# Patient Record
Sex: Female | Born: 1945 | Race: Black or African American | Hispanic: No | Marital: Married | Smoking: Never smoker
Health system: Southern US, Community
[De-identification: ages and names within clinical notes are randomized; demographics above are authoritative.]

## PROBLEM LIST (undated history)

## (undated) DIAGNOSIS — K5900 Constipation, unspecified: Secondary | ICD-10-CM

## (undated) DIAGNOSIS — N289 Disorder of kidney and ureter, unspecified: Secondary | ICD-10-CM

## (undated) DIAGNOSIS — I10 Essential (primary) hypertension: Secondary | ICD-10-CM

## (undated) HISTORY — PX: TONSILLECTOMY: SUR1361

## (undated) HISTORY — PX: HOT HEMOSTASIS: SHX5433

## (undated) HISTORY — PX: FRACTURE SURGERY: SHX138

---

## 1998-01-02 ENCOUNTER — Emergency Department (HOSPITAL_COMMUNITY): Admission: EM | Admit: 1998-01-02 | Discharge: 1998-01-03 | Payer: Self-pay

## 1998-01-24 ENCOUNTER — Encounter: Admission: RE | Admit: 1998-01-24 | Discharge: 1998-01-24 | Payer: Self-pay | Admitting: *Deleted

## 2001-04-30 ENCOUNTER — Emergency Department (HOSPITAL_COMMUNITY): Admission: EM | Admit: 2001-04-30 | Discharge: 2001-04-30 | Payer: Self-pay | Admitting: Emergency Medicine

## 2004-06-06 ENCOUNTER — Emergency Department (HOSPITAL_COMMUNITY): Admission: EM | Admit: 2004-06-06 | Discharge: 2004-06-07 | Payer: Self-pay | Admitting: Emergency Medicine

## 2012-01-30 ENCOUNTER — Emergency Department (HOSPITAL_COMMUNITY): Payer: Self-pay

## 2012-01-30 ENCOUNTER — Encounter (HOSPITAL_COMMUNITY): Payer: Self-pay | Admitting: Emergency Medicine

## 2012-01-30 ENCOUNTER — Emergency Department (HOSPITAL_COMMUNITY)
Admission: EM | Admit: 2012-01-30 | Discharge: 2012-01-30 | Disposition: A | Discharge status: Home / Self Care | Payer: Self-pay | Attending: Emergency Medicine | Admitting: Emergency Medicine

## 2012-01-30 DIAGNOSIS — K219 Gastro-esophageal reflux disease without esophagitis: Secondary | ICD-10-CM | POA: Insufficient documentation

## 2012-01-30 DIAGNOSIS — E876 Hypokalemia: Secondary | ICD-10-CM | POA: Insufficient documentation

## 2012-01-30 DIAGNOSIS — I10 Essential (primary) hypertension: Secondary | ICD-10-CM | POA: Insufficient documentation

## 2012-01-30 DIAGNOSIS — Z79899 Other long term (current) drug therapy: Secondary | ICD-10-CM | POA: Insufficient documentation

## 2012-01-30 DIAGNOSIS — R109 Unspecified abdominal pain: Secondary | ICD-10-CM | POA: Insufficient documentation

## 2012-01-30 HISTORY — DX: Disorder of kidney and ureter, unspecified: N28.9

## 2012-01-30 HISTORY — DX: Constipation, unspecified: K59.00

## 2012-01-30 HISTORY — DX: Essential (primary) hypertension: I10

## 2012-01-30 LAB — URINALYSIS, ROUTINE W REFLEX MICROSCOPIC
Nitrite: NEGATIVE
Specific Gravity, Urine: 1.016 (ref 1.005–1.030)
pH: 6 (ref 5.0–8.0)

## 2012-01-30 LAB — CBC WITH DIFFERENTIAL/PLATELET
Basophils Absolute: 0 10*3/uL (ref 0.0–0.1)
HCT: 37.4 % (ref 36.0–46.0)
Hemoglobin: 13 g/dL (ref 12.0–15.0)
Lymphocytes Relative: 49 % — ABNORMAL HIGH (ref 12–46)
Monocytes Absolute: 0.4 10*3/uL (ref 0.1–1.0)
Neutro Abs: 2.2 10*3/uL (ref 1.7–7.7)
RDW: 13.2 % (ref 11.5–15.5)
WBC: 5.3 10*3/uL (ref 4.0–10.5)

## 2012-01-30 LAB — BASIC METABOLIC PANEL
Chloride: 90 mEq/L — ABNORMAL LOW (ref 96–112)
Creatinine, Ser: 0.76 mg/dL (ref 0.50–1.10)
GFR calc Af Amer: >90 mL/min (ref >90)
Potassium: 2.7 mEq/L — CL (ref 3.5–5.1)

## 2012-01-30 LAB — URINE MICROSCOPIC-ADD ON

## 2012-01-30 MED ORDER — RANITIDINE HCL 150 MG PO CAPS: 150.0000 mg | ORAL_CAPSULE | Freq: Two times a day (BID) | ORAL | Status: DC

## 2012-01-30 MED ORDER — POTASSIUM CHLORIDE ER 10 MEQ PO TBCR: 10.0000 meq | EXTENDED_RELEASE_TABLET | Freq: Two times a day (BID) | ORAL | Status: DC

## 2012-01-30 MED ORDER — POTASSIUM CHLORIDE CRYS ER 20 MEQ PO TBCR
EXTENDED_RELEASE_TABLET | ORAL | Status: AC
  Administered 2012-01-30: 20 meq
  Filled 2012-01-30: qty 20

## 2012-01-30 MED ORDER — POTASSIUM CHLORIDE CRYS ER 20 MEQ PO TBCR
40.0000 meq | EXTENDED_RELEASE_TABLET | Freq: Once | ORAL | Status: AC
  Administered 2012-01-30: 40 meq via ORAL
  Filled 2012-01-30: qty 2

## 2012-01-30 NOTE — ED Notes (Signed)
Patient states that she started to take a new anti hypertension medication yesterday and since she has had burning in her lower pelvic region to her epigastric region

## 2012-01-30 NOTE — ED Provider Notes (Signed)
History     CSN: [id number]  Arrival date & time 01/30/12  0046   First MD Initiated Contact with Patient 01/30/12 0207      Chief Complaint  Patient presents with  . Abdominal Pain    (Consider location/radiation/quality/duration/timing/severity/associated sxs/prior treatment) Patient is a 66 y.o. female presenting with abdominal pain. The history is provided by the patient (the pt complains of burning in her stomach). No language interpreter was used.  Abdominal Pain The primary symptoms of the illness include abdominal pain. The primary symptoms of the illness do not include fatigue or diarrhea. The current episode started 13 to 24 hours ago. The onset of the illness was gradual. The problem has not changed since onset. The illness is associated with recent antibiotic use. The patient states that she believes she is currently not pregnant. The patient has not had a change in bowel habit. Symptoms associated with the illness do not include chills, hematuria, frequency or back pain. Significant associated medical issues include GERD.    Past Medical History  Diagnosis Date  . Hypertension   . Renal disorder   . Constipated     Past Surgical History  Procedure Date  . Hot hemostasis   . Tonsillectomy     History reviewed. No pertinent family history.  History  Substance Use Topics  . Smoking status: Never Smoker   . Smokeless tobacco: Not on file  . Alcohol Use: No    OB History    Grav Para Term Preterm Abortions TAB SAB Ect Mult Living                  Review of Systems  Constitutional: Negative for chills and fatigue.  HENT: Negative for congestion, sinus pressure and ear discharge.   Eyes: Negative for discharge.  Respiratory: Negative for cough.   Cardiovascular: Negative for chest pain.  Gastrointestinal: Positive for abdominal pain. Negative for diarrhea.  Genitourinary: Negative for frequency and hematuria.  Musculoskeletal: Negative for back pain.    Skin: Negative for rash.  Neurological: Negative for seizures and headaches.  Hematological: Negative.   Psychiatric/Behavioral: Negative for hallucinations.    Allergies  Review of patient's allergies indicates no known allergies.  Home Medications   Current Outpatient Rx  Name Route Sig Dispense Refill  . AMLODIPINE BESYLATE 10 MG PO TABS Oral Take 10 mg by mouth daily.    Marland Kitchen HYDROCHLOROTHIAZIDE 25 MG PO TABS Oral Take 25 mg by mouth daily.    Marland Kitchen POLYETHYLENE GLYCOL 3350 PO PACK Oral Take 17 g by mouth daily as needed. For constipation    . POTASSIUM CHLORIDE ER 10 MEQ PO TBCR Oral Take 1 tablet (10 mEq total) by mouth 2 (two) times daily. 20 tablet 0  . RANITIDINE HCL 150 MG PO CAPS Oral Take 1 capsule (150 mg total) by mouth 2 (two) times daily. 60 capsule 0    BP 168/86  Pulse 85  Temp 98.4 F (36.9 C) (Oral)  Resp 20  SpO2 98%  Physical Exam  Constitutional: She is oriented to person, place, and time. She appears well-developed.  HENT:  Head: Normocephalic and atraumatic.  Eyes: Conjunctivae and EOM are normal. No scleral icterus.  Neck: Neck supple. No thyromegaly present.  Cardiovascular: Normal rate and regular rhythm.  Exam reveals no gallop and no friction rub.   No murmur heard. Pulmonary/Chest: No stridor. She has no wheezes. She has no rales. She exhibits no tenderness.  Abdominal: She exhibits no distension. There is no  tenderness. There is no rebound.  Musculoskeletal: Normal range of motion. She exhibits no edema.  Lymphadenopathy:    She has no cervical adenopathy.  Neurological: She is oriented to person, place, and time. Coordination normal.  Skin: No rash noted. No erythema.  Psychiatric: She has a normal mood and affect. Her behavior is normal.    ED Course  Procedures (including critical care time)  Labs Reviewed  URINALYSIS, ROUTINE W REFLEX MICROSCOPIC - Abnormal; Notable for the following:    Leukocytes, UA TRACE (*)     All other  components within normal limits  BASIC METABOLIC PANEL - Abnormal; Notable for the following:    Sodium 132 (*)     Potassium 2.7 (*)     Chloride 90 (*)     Glucose, Bld 117 (*)     GFR calc non Af Amer 87 (*)     All other components within normal limits  CBC WITH DIFFERENTIAL - Abnormal; Notable for the following:    Neutrophils Relative 41 (*)     Lymphocytes Relative 49 (*)     All other components within normal limits  URINE MICROSCOPIC-ADD ON - Abnormal; Notable for the following:    Squamous Epithelial / LPF FEW (*)     All other components within normal limits  URINE CULTURE   Dg Abd Acute W/chest  01/30/2012  *RADIOLOGY REPORT*  Clinical Data: Upper abdominal burning for 2 days.  ACUTE ABDOMEN SERIES (ABDOMEN 2 VIEW & CHEST 1 VIEW)  Comparison: None.  Findings: The heart size and mediastinal contours are normal. There is a suspected small granuloma at the left lung base and calcified left hilar lymph nodes.  The lungs are otherwise clear. There is no pleural effusion.  The bowel gas pattern is normal.  There is no free intraperitoneal air or suspicious abdominal calcification.  There is mild lower lumbar spine facet disease.  IMPRESSION:  1.  No active cardiopulmonary or abdominal process demonstrated. 2.  Suspected old granulomatous disease at the left lung base.  Original Report Authenticated By: Gerrianne Scale, M.D.     1. Hypokalemia   2. GERD (gastroesophageal reflux disease)       MDM          Benny Lennert, MD 01/30/12 [phone]

## 2012-01-31 LAB — URINE CULTURE

## 2012-11-04 ENCOUNTER — Encounter (HOSPITAL_BASED_OUTPATIENT_CLINIC_OR_DEPARTMENT_OTHER): Payer: Self-pay | Admitting: *Deleted

## 2012-11-04 ENCOUNTER — Emergency Department (HOSPITAL_BASED_OUTPATIENT_CLINIC_OR_DEPARTMENT_OTHER)
Admission: EM | Admit: 2012-11-04 | Discharge: 2012-11-04 | Disposition: A | Discharge status: Home / Self Care | Payer: Worker's Compensation | Attending: Emergency Medicine | Admitting: Emergency Medicine

## 2012-11-04 DIAGNOSIS — F411 Generalized anxiety disorder: Secondary | ICD-10-CM | POA: Insufficient documentation

## 2012-11-04 DIAGNOSIS — Y9389 Activity, other specified: Secondary | ICD-10-CM | POA: Insufficient documentation

## 2012-11-04 DIAGNOSIS — R110 Nausea: Secondary | ICD-10-CM | POA: Insufficient documentation

## 2012-11-04 DIAGNOSIS — Z8719 Personal history of other diseases of the digestive system: Secondary | ICD-10-CM | POA: Insufficient documentation

## 2012-11-04 DIAGNOSIS — Z87448 Personal history of other diseases of urinary system: Secondary | ICD-10-CM | POA: Insufficient documentation

## 2012-11-04 DIAGNOSIS — I10 Essential (primary) hypertension: Secondary | ICD-10-CM | POA: Insufficient documentation

## 2012-11-04 DIAGNOSIS — Z79899 Other long term (current) drug therapy: Secondary | ICD-10-CM | POA: Insufficient documentation

## 2012-11-04 DIAGNOSIS — Y92410 Unspecified street and highway as the place of occurrence of the external cause: Secondary | ICD-10-CM | POA: Insufficient documentation

## 2012-11-04 DIAGNOSIS — Z043 Encounter for examination and observation following other accident: Secondary | ICD-10-CM | POA: Insufficient documentation
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
## 2016-07-13 ENCOUNTER — Encounter (HOSPITAL_COMMUNITY): Payer: Self-pay | Admitting: Emergency Medicine

## 2016-07-13 ENCOUNTER — Emergency Department (HOSPITAL_COMMUNITY)
Admission: EM | Admit: 2016-07-13 | Discharge: 2016-07-13 | Disposition: A | Discharge status: Home / Self Care | Payer: Medicare Other | Attending: Emergency Medicine | Admitting: Emergency Medicine

## 2016-07-13 DIAGNOSIS — I10 Essential (primary) hypertension: Secondary | ICD-10-CM | POA: Insufficient documentation

## 2016-07-13 DIAGNOSIS — K219 Gastro-esophageal reflux disease without esophagitis: Secondary | ICD-10-CM | POA: Diagnosis not present

## 2016-07-13 DIAGNOSIS — Z79899 Other long term (current) drug therapy: Secondary | ICD-10-CM | POA: Diagnosis not present

## 2016-07-13 DIAGNOSIS — R1010 Upper abdominal pain, unspecified: Secondary | ICD-10-CM | POA: Diagnosis present

## 2016-07-13 LAB — COMPREHENSIVE METABOLIC PANEL
ALK PHOS: 66 U/L (ref 38–126)
ALT: 20 U/L (ref 14–54)
ANION GAP: 9 (ref 5–15)
AST: 25 U/L (ref 15–41)
Albumin: 4.4 g/dL (ref 3.5–5.0)
BUN: 28 mg/dL — ABNORMAL HIGH (ref 6–20)
CALCIUM: 9.6 mg/dL (ref 8.9–10.3)
CHLORIDE: 103 mmol/L (ref 101–111)
CO2: 22 mmol/L (ref 22–32)
Creatinine, Ser: 1.16 mg/dL — ABNORMAL HIGH (ref 0.44–1.00)
GFR calc Af Amer: 55 mL/min — ABNORMAL LOW (ref >60)
GFR calc non Af Amer: 47 mL/min — ABNORMAL LOW (ref >60)
GLUCOSE: 108 mg/dL — AB (ref 65–99)
Potassium: 4.1 mmol/L (ref 3.5–5.1)
SODIUM: 134 mmol/L — AB (ref 135–145)
Total Bilirubin: 0.6 mg/dL (ref 0.3–1.2)
Total Protein: 7.9 g/dL (ref 6.5–8.1)

## 2016-07-13 LAB — CBC
HCT: 33.9 % — ABNORMAL LOW (ref 36.0–46.0)
HEMOGLOBIN: 11.7 g/dL — AB (ref 12.0–15.0)
MCH: 30.6 pg (ref 26.0–34.0)
MCHC: 34.5 g/dL (ref 30.0–36.0)
MCV: 88.7 fL (ref 78.0–100.0)
PLATELETS: 340 10*3/uL (ref 150–400)
RBC: 3.82 MIL/uL — ABNORMAL LOW (ref 3.87–5.11)
RDW: 13.5 % (ref 11.5–15.5)
WBC: 4.4 10*3/uL (ref 4.0–10.5)

## 2016-07-13 LAB — LIPASE, BLOOD: Lipase: 19 U/L (ref 11–51)

## 2016-07-13 MED ORDER — GI COCKTAIL ~~LOC~~
30.0000 mL | Freq: Once | ORAL | Status: AC
  Administered 2016-07-13: 30 mL via ORAL
  Filled 2016-07-13: qty 30

## 2016-07-13 MED ORDER — SODIUM CHLORIDE 0.9 % IV BOLUS (SEPSIS)
1000.0000 mL | Freq: Once | INTRAVENOUS | Status: AC
  Administered 2016-07-13: 1000 mL via INTRAVENOUS

## 2016-07-13 MED ORDER — PANTOPRAZOLE SODIUM 20 MG PO TBEC: 20.0000 mg | DELAYED_RELEASE_TABLET | Freq: Every day | ORAL | 0 refills | Status: DC

## 2016-07-13 NOTE — ED Provider Notes (Signed)
[facility]-EMERGENCY DEPT Provider Note   CSN: [id number] Arrival date & time: 07/13/16  0003    By signing my name below, I, Sierra Gregory, attest that this documentation has been prepared under the direction and in the presence of [name], MD. Electronically Signed: Valentino Gregory, ED Scribe. 07/13/16. 12:45 AM.  History   Chief Complaint Chief Complaint  Patient presents with  . Abdominal Pain   The history is provided by the patient. No language interpreter was used.   HPI Comments: Sierra Gregory is a 71 y.o. female with PMHx of HTN, renal disorder and constipation who presents to the Emergency Department complaining of moderate, constant, abdominal pain onset a week ago. Pt states she was feeling bloated and reports having a bowel movement immediately after today. She notes eating a salad today with vinaigrette dressing and reports having a burning sensation in her upper abdomen. She notes taking a  Zantac with minimal relief. Pt states she has been able to pass gas without difficulty. She reports her last bowel movement was earlier today. Denies CP, vomiting and nausea. She notes having similar episodes in the past, and states symptoms are similar.   Past Medical History:  Diagnosis Date  . Constipated   . Hypertension   . Renal disorder     There are no active problems to display for this patient.   Past Surgical History:  Procedure Laterality Date  . HOT HEMOSTASIS    . TONSILLECTOMY      OB History    No data available       Home Medications    Prior to Admission medications   Medication Sig Start Date End Date Taking? Authorizing Provider  amLODipine (NORVASC) 10 MG tablet Take 10 mg by mouth daily.   Yes Historical Provider, MD  atenolol (TENORMIN) 50 MG tablet Take 50 mg by mouth daily. 06/11/16  Yes Historical Provider, MD  spironolactone (ALDACTONE) 50 MG tablet Take 50 mg by mouth daily. 07/12/16  Yes Historical Provider, MD  pantoprazole (PROTONIX)  20 MG tablet Take 1 tablet (20 mg total) by mouth daily. 07/13/16   [name], MD  potassium chloride (K-DUR) 10 MEQ tablet Take 1 tablet (10 mEq total) by mouth 2 (two) times daily. 01/30/12 01/29/13  Bethann Berkshire, MD  ranitidine (ZANTAC) 150 MG capsule Take 1 capsule (150 mg total) by mouth 2 (two) times daily. 01/30/12 01/29/13  Bethann Berkshire, MD    Family History History reviewed. No pertinent family history.  Social History Social History  Substance Use Topics  . Smoking status: Never Smoker  . Smokeless tobacco: Never Used  . Alcohol use No     Allergies   Lasix [furosemide]   Review of Systems Review of Systems  Cardiovascular: Negative for chest pain.  Gastrointestinal: Positive for abdominal pain. Negative for nausea and vomiting.  All other systems reviewed and are negative.    Physical Exam Updated Vital Signs BP 132/66 (BP Location: Left Arm)   Pulse 63   Temp 97.6 F (36.4 C) (Oral)   Resp 16   Ht 5\' 6"  (1.676 m)   Wt 95.3 kg   SpO2 99%   BMI 33.89 kg/m  Vitals reviewed Physical Exam Physical Examination: General appearance - alert, well appearing, and in no distress Mental status - alert, oriented to person, place, and time Eyes - no conjunctival injection no scleral icterus Mouth - mucous membranes moist, pharynx normal without lesions Chest - clear to auscultation, no wheezes, rales or rhonchi,  symmetric air entry Heart - normal rate, regular rhythm, normal S1, S2, no murmurs, rubs, clicks or gallops Abdomen - soft, mild tenderness to palpation of epigastric region, no gaurding, no rebound tenderness, nondistended, no masses or organomegaly Neurological - alert, oriented, normal speech Extremities - peripheral pulses normal, no pedal edema, no clubbing or cyanosis Skin - normal coloration and turgor, no rashes  ED Treatments / Results   DIAGNOSTIC STUDIES: Oxygen Saturation is 99% on RA, normal by my interpretation.    COORDINATION OF  CARE: 12:41 AM Discussed treatment plan with pt at bedside which includes labs and pt agreed to plan.   Labs (all labs ordered are listed, but only abnormal results are displayed) Labs Reviewed  CBC - Abnormal; Notable for the following:       Result Value   RBC 3.82 (*)    Hemoglobin 11.7 (*)    HCT 33.9 (*)    All other components within normal limits  COMPREHENSIVE METABOLIC PANEL - Abnormal; Notable for the following:    Sodium 134 (*)    Glucose, Bld 108 (*)    BUN 28 (*)    Creatinine, Ser 1.16 (*)    GFR calc non Af Amer 47 (*)    GFR calc Af Amer 55 (*)    All other components within normal limits  LIPASE, BLOOD    EKG  EKG Interpretation  Date/Time:  Sunday July 13 2016 01:01:31 EST Ventricular Rate:  68 PR Interval:    QRS Duration: 86 QT Interval:  400 QTC Calculation: 426 R Axis:   45 Text Interpretation:  Sinus rhythm No old tracing to compare Confirmed by [facility]  MD, Velena Keegan [phone]([id number]) on 07/13/2016 1:48:37 AM       Radiology No results found.  Procedures Procedures (including critical care time)  Medications Ordered in ED Medications  sodium chloride 0.9 % bolus 1,000 mL (0 mLs Intravenous Stopped 07/13/16 0416)  gi cocktail (Maalox,Lidocaine,Donnatal) (30 mLs Oral Given 07/13/16 0232)     Initial Impression / Assessment and Plan / ED Course  I have reviewed the triage vital signs and the nursing notes.  Pertinent labs & imaging results that were available during my care of the patient were reviewed by me and considered in my medical decision making (see chart for details).  Clinical Course     Pt presenting with c/o epigastric pain.  Labs are reassuring in terms of lipase and LFTs.  Pt with mild renal insufficiency.  She feels improved after IV fluids and GI cocktail. EKG obtained and reassuring however doubt ACS as cause of her symptoms.  More likely related to GERD.  Pt discharge with rx for PPI.  Discharged with strict return precautions.  Pt  agreeable with plan.  Final Clinical Impressions(s) / ED Diagnoses   Final diagnoses:  Gastroesophageal reflux disease, esophagitis presence not specified    New Prescriptions Discharge Medication List as of 07/13/2016  4:21 AM    START taking these medications   Details  pantoprazole (PROTONIX) 20 MG tablet Take 1 tablet (20 mg total) by mouth daily., Starting Sun 07/13/2016, Print       I personally performed the services described in this documentation, which was scribed in my presence. The recorded information has been reviewed and is accurate.       Jerelyn ScottMartha Linker, MD 07/17/16 [phone]

## 2016-07-13 NOTE — Discharge Instructions (Signed)
Return to the ED with any concerns including vomiting and not able to keep down liquids or your medications, abdominal pain especially if it localizes to the right lower abdomen, chest pain, difficulty breathing, fever or chills, and decreased urine output, decreased level of alertness or lethargy, or any other alarming symptoms.

## 2016-07-13 NOTE — ED Triage Notes (Signed)
Pt reports feeling more bloated for the last week. Pt denies any vomiting or diarrhea. Pt states she feels pressure in upper abd.

## 2016-09-21 ENCOUNTER — Ambulatory Visit (HOSPITAL_COMMUNITY)
Admission: EM | Admit: 2016-09-21 | Discharge: 2016-09-21 | Disposition: A | Discharge status: Home / Self Care | Payer: Medicare Other | Attending: Family Medicine | Admitting: Family Medicine

## 2016-09-21 ENCOUNTER — Encounter (HOSPITAL_COMMUNITY): Payer: Self-pay | Admitting: Emergency Medicine

## 2016-09-21 DIAGNOSIS — M1711 Unilateral primary osteoarthritis, right knee: Secondary | ICD-10-CM

## 2016-09-21 NOTE — ED Triage Notes (Signed)
The patient presented to the [facility] with a complaint of right knee pain x 2 weeks. The patient denied any known injury.  

## 2016-09-21 NOTE — ED Provider Notes (Signed)
CSN: [id number]     Arrival date & time 09/21/16  1609 History   First MD Initiated Contact with Patient 09/21/16 1743     Chief Complaint  Patient presents with  . Knee Pain   (Consider location/radiation/quality/duration/timing/severity/associated sxs/prior Treatment) The history is provided by the patient.  Knee Pain  Location:  Knee Time since incident:  2 weeks Injury: no   Knee location:  R knee Pain details:    Quality:  Aching and throbbing   Radiates to:  Does not radiate   Severity:  Moderate   Onset quality:  Gradual   Duration:  2 weeks   Timing:  Intermittent   Progression:  Waxing and waning Chronicity:  New Dislocation: no   Tetanus status:  Unknown Prior injury to area:  No Relieved by:  Acetaminophen, heat, ice and movement Exacerbated by: rest, worse when sitting for extended periods and first thing in the morning. Associated symptoms: no back pain, no decreased ROM, no fatigue, no fever, no muscle weakness, no neck pain, no numbness, no stiffness, no swelling and no tingling   Risk factors: obesity   Risk factors: no frequent fractures and no known bone disorder     Past Medical History:  Diagnosis Date  . Constipated   . Hypertension   . Renal disorder    Past Surgical History:  Procedure Laterality Date  . HOT HEMOSTASIS    . TONSILLECTOMY     History reviewed. No pertinent family history. Social History  Substance Use Topics  . Smoking status: Never Smoker  . Smokeless tobacco: Never Used  . Alcohol use No   OB History    No data available     Review of Systems  Reason unable to perform ROS: as covered in HPI.  Constitutional: Negative for fatigue and fever.  Musculoskeletal: Negative for back pain, neck pain and stiffness.  All other systems reviewed and are negative.   Allergies  Lasix [furosemide]  Home Medications   Prior to Admission medications   Medication Sig Start Date End Date Taking? Authorizing Provider  amLODipine  (NORVASC) 10 MG tablet Take 10 mg by mouth daily.    Historical Provider, MD  atenolol (TENORMIN) 50 MG tablet Take 50 mg by mouth daily. 06/11/16   Historical Provider, MD  spironolactone (ALDACTONE) 50 MG tablet Take 50 mg by mouth daily. 07/12/16   Historical Provider, MD   Meds Ordered and Administered this Visit  Medications - No data to display  BP (!) 173/70 (BP Location: Right Arm)   Pulse 68   Temp 97.9 F (36.6 C) (Oral)   Resp 20   SpO2 100%  No data found.   Physical Exam  Constitutional: She is oriented to person, place, and time. She appears well-developed and well-nourished. No distress.  HENT:  Head: Normocephalic and atraumatic.  Right Ear: External ear normal.  Left Ear: External ear normal.  Musculoskeletal: She exhibits no edema or tenderness.       Right knee: She exhibits normal range of motion, no swelling, no effusion, no deformity, no erythema, normal alignment, no LCL laxity, normal patellar mobility and no MCL laxity. No tenderness found. No medial joint line, no lateral joint line, no MCL and no LCL tenderness noted.  No abnormal findings on exam of the right knee.  Neurological: She is alert and oriented to person, place, and time.  Skin: Skin is warm and dry. Capillary refill takes less than 2 seconds. No rash noted. She is not  diaphoretic. No erythema.  Psychiatric: She has a normal mood and affect. Her behavior is normal.  Nursing note and vitals reviewed.   Urgent Care Course     Procedures (including critical care time)  Labs Review Labs Reviewed - No data to display  Imaging Review No results found.    MDM   1. Osteoarthritis of right knee, unspecified osteoarthritis type   Most likely osteoarthritis the right knee. Recommend movement, warmth 15 minutes at a time 4-5 times a day, over-the-counter Tylenol. Recommend following up with primary care, or orthopedics if symptoms persist.     Dorena Bodo, NP 09/21/16 1825

## 2016-09-21 NOTE — Discharge Instructions (Signed)
For your knee pain, I recommend over-the-counter Tylenol every 6 hours. If your pain persists, follow up with your primary care provider, or an orthopedist.

## 2016-09-25 ENCOUNTER — Ambulatory Visit (INDEPENDENT_AMBULATORY_CARE_PROVIDER_SITE_OTHER): Payer: Medicare Other

## 2016-09-25 ENCOUNTER — Ambulatory Visit (INDEPENDENT_AMBULATORY_CARE_PROVIDER_SITE_OTHER): Payer: Medicare Other | Admitting: Physician Assistant

## 2016-09-25 VITALS — BP 164/72 | HR 65 | Temp 97.6°F | Resp 18 | Ht 66.0 in | Wt 242.4 lb

## 2016-09-25 DIAGNOSIS — M1711 Unilateral primary osteoarthritis, right knee: Secondary | ICD-10-CM | POA: Diagnosis not present

## 2016-09-25 DIAGNOSIS — M25561 Pain in right knee: Secondary | ICD-10-CM | POA: Diagnosis not present

## 2016-09-25 NOTE — Patient Instructions (Addendum)
I would like you to ice the knee three times per day for 15 minutes.   I would like you to elevate this a much as possible.   Please return in 2 weeks if you have no improvement of your pain.  You can also use an aspercreme along the knee.  CLINICAL DATA:  Right knee pain following climbing stairs, initial encounter EXAM: RIGHT KNEE - COMPLETE 4+ VIEW COMPARISON:  None. FINDINGS: No acute fracture or dislocation is noted. Mild tricompartmental degenerative change is seen. No joint effusion is noted. IMPRESSION: Mild degenerative change without acute abnormality per Electronically Signed   By: Alcide CleverMark  Lukens M.D.   On: 09/25/2016 10:17     IF you received an x-ray today, you will receive an invoice from [facility]. Please contact [facility] at [phone] with questions or concerns regarding your invoice.   IF you received labwork today, you will receive an invoice from [facility]. Please contact [facility] at [phone] with questions or concerns regarding your invoice.   Our billing staff will not be able to assist you with questions regarding bills from these companies.  You will be contacted with the lab results as soon as they are available. The fastest way to get your results is to activate your My Chart account. Instructions are located on the last page of this paperwork. If you have not heard from us regarding the results in 2 weeks, please contact this office.

## 2016-09-25 NOTE — Progress Notes (Signed)
[facility] [address], [place] [phone]  Date:  09/25/2016   Name:  Sierra Gregory   DOB:  08-Mar-1946   MRN:  [id number]  PCP:  PROVIDER NOT IN SYSTEM    History of Present Illness:  Sierra Gregory is a 71 y.o. female patient who presents to PCP with  Chief Complaint  Patient presents with  . Knee Pain    right knee the inside of knee hurts and sore      --she was coming up the stairs, 2 weeks ago, and felt a pull, then started to have soreness and stiffness.  5 days ago, she went to the urgent care.  Advised to take tylenol 500mg  every 6 hours.  She has been taking it 0-1 500mg  daily with no improvement, and placing ice on the right knee. No instability.  She has never had an injury or pain in this right knee.    There are no active problems to display for this patient.   Past Medical History:  Diagnosis Date  . Constipated   . Hypertension   . Renal disorder     Past Surgical History:  Procedure Laterality Date  . FRACTURE SURGERY    . HOT HEMOSTASIS    . TONSILLECTOMY      Social History  Substance Use Topics  . Smoking status: Never Smoker  . Smokeless tobacco: Never Used  . Alcohol use No    Family History  Problem Relation Age of Onset  . Cancer Mother   . Cancer Sister   . Cancer Maternal Grandmother     Allergies  Allergen Reactions  . Lasix [Furosemide]     Lowers potassium      Medication list has been reviewed and updated.  Current Outpatient Prescriptions on File Prior to Visit  Medication Sig Dispense Refill  . amLODipine (NORVASC) 10 MG tablet Take 10 mg by mouth daily.    Marland Kitchen. atenolol (TENORMIN) 50 MG tablet Take 50 mg by mouth daily.    Marland Kitchen. spironolactone (ALDACTONE) 50 MG tablet Take 50 mg by mouth daily.     No current facility-administered medications on file prior to visit.     ROS ROS otherwise unremarkable unless listed above.  Physical Examination: BP (!) 164/72 (BP Location: Left Arm, Patient  Position: Sitting, Cuff Size: Large)   Pulse 65   Temp 97.6 F (36.4 C) (Oral)   Resp 18   Ht 5\' 6"  (1.676 m)   Wt 242 lb 6.4 oz (110 kg)   SpO2 96%   BMI 39.12 kg/m  Ideal Body Weight: Weight in (lb) to have BMI = 25: 154.6  Physical Exam  Constitutional: She is oriented to person, place, and time. She appears well-developed and well-nourished. No distress.  HENT:  Head: Normocephalic and atraumatic.  Right Ear: External ear normal.  Left Ear: External ear normal.  Eyes: Conjunctivae and EOM are normal. Pupils are equal, round, and reactive to light.  Cardiovascular: Normal rate.   Pulmonary/Chest: Effort normal. No respiratory distress.  Musculoskeletal:       Right knee: She exhibits abnormal alignment (slight valgus to the alignment.). She exhibits normal range of motion, no swelling, no effusion and normal meniscus. Tenderness found. Medial joint line tenderness noted. No lateral joint line, no MCL and no LCL tenderness noted.  Neurological: She is alert and oriented to person, place, and time.  Skin: She is not diaphoretic.  Psychiatric: She has a normal mood and  affect. Her behavior is normal.    Dg Knee Complete 4 Views Right  Result Date: 09/25/2016 CLINICAL DATA:  Right knee pain following climbing stairs, initial encounter EXAM: RIGHT KNEE - COMPLETE 4+ VIEW COMPARISON:  None. FINDINGS: No acute fracture or dislocation is noted. Mild tricompartmental degenerative change is seen. No joint effusion is noted. IMPRESSION: Mild degenerative change without acute abnormality per Electronically Signed   By: Alcide Clever M.D.   On: 09/25/2016 10:17     Assessment and Plan: Sierra Gregory is a 71 y.o. female who is here today for cc of right knee pain. Advised icing.  Placed in knee brace. Due to Kfunction, continue tylenol more aptly, and she can also use aspercreme otc. rtc in 2 weeks if no improvement.   Tricompartment osteoarthritis of right knee  Acute pain of right  knee - Plan: DG Knee Complete 4 Views Right  Trena Platt, PA-C [facility] 3/23/20189:54 AM

## 2018-05-11 ENCOUNTER — Ambulatory Visit (INDEPENDENT_AMBULATORY_CARE_PROVIDER_SITE_OTHER): Payer: Medicare Other

## 2018-05-11 ENCOUNTER — Encounter: Payer: Self-pay | Admitting: Podiatry

## 2018-05-11 ENCOUNTER — Ambulatory Visit (INDEPENDENT_AMBULATORY_CARE_PROVIDER_SITE_OTHER): Payer: Medicare Other | Admitting: Podiatry

## 2018-05-11 ENCOUNTER — Encounter

## 2018-05-11 VITALS — BP 138/75 | HR 70 | Resp 16

## 2018-05-11 DIAGNOSIS — Q828 Other specified congenital malformations of skin: Secondary | ICD-10-CM | POA: Diagnosis not present

## 2018-05-11 DIAGNOSIS — M2041 Other hammer toe(s) (acquired), right foot: Secondary | ICD-10-CM

## 2018-05-11 NOTE — Progress Notes (Signed)
  Subjective:  Patient ID: Sierra Gregory, female    DOB: 1946-06-05,  MRN: [id number] HPI Chief Complaint  Patient presents with  . Toe Pain    5th toe right - corn interdigital, got infected x 1 week ago, been on cephalexin (completed), better, but still tender  . New Patient (Initial Visit)    72 y.o. female presents with the above complaint.   ROS: Denies fever chills nausea vomiting muscle aches pains.  Past Medical History:  Diagnosis Date  . Constipated   . Hypertension   . Renal disorder    Past Surgical History:  Procedure Laterality Date  . FRACTURE SURGERY    . HOT HEMOSTASIS    . TONSILLECTOMY      Current Outpatient Medications:  .  amLODipine (NORVASC) 10 MG tablet, Take 10 mg by mouth daily., Disp: , Rfl:  .  atenolol (TENORMIN) 50 MG tablet, Take 50 mg by mouth daily., Disp: , Rfl:  .  spironolactone (ALDACTONE) 50 MG tablet, Take 50 mg by mouth daily., Disp: , Rfl:   Allergies  Allergen Reactions  . Lasix [Furosemide]     Lowers potassium     Review of Systems Objective:   Vitals:   05/11/18 1007  BP: 138/75  Pulse: 70  Resp: 16    General: Well developed, nourished, in no acute distress, alert and oriented x3   Dermatological: Skin is warm, dry and supple bilateral. Nails x 10 are well maintained; remaining integument appears unremarkable at this time. There are no open sores, no preulcerative lesions, no rash or signs of infection present.  Reactive hyperkeratotic lesion medial aspect of the fifth digit right foot appears to be healing very nicely at this point but you can tell if this was once infected and was a blister.  Vascular: Dorsalis Pedis artery and Posterior Tibial artery pedal pulses are 2/4 bilateral with immedate capillary fill time. Pedal hair growth present. No varicosities and no lower extremity edema present bilateral.   Neruologic: Grossly intact via light touch bilateral. Vibratory intact via tuning fork bilateral. Protective  threshold with Semmes Wienstein monofilament intact to all pedal sites bilateral. Patellar and Achilles deep tendon reflexes 2+ bilateral. No Babinski or clonus noted bilateral.   Musculoskeletal: No gross boney pedal deformities bilateral. No pain, crepitus, or limitation noted with foot and ankle range of motion bilateral. Muscular strength 5/5 in all groups tested bilateral.  Gait: Unassisted, Nonantalgic.    Radiographs:  None taken  Assessment & Plan:   Assessment: Interdigital callus that has been infected but is healed now.  Plan: Debrided reactive hyperkeratosis for her today.  Placed padding discussed the need for surgical procedure.      T. Bison, [place]

## 2018-06-28 IMAGING — DX DG KNEE COMPLETE 4+V*R*
4 series · 4 of 4 positions shown · non-contrast
Comparison: None.

CLINICAL DATA: Right knee pain following climbing stairs, initial
encounter

EXAM:
RIGHT KNEE - COMPLETE 4+ VIEW

[knee ap]
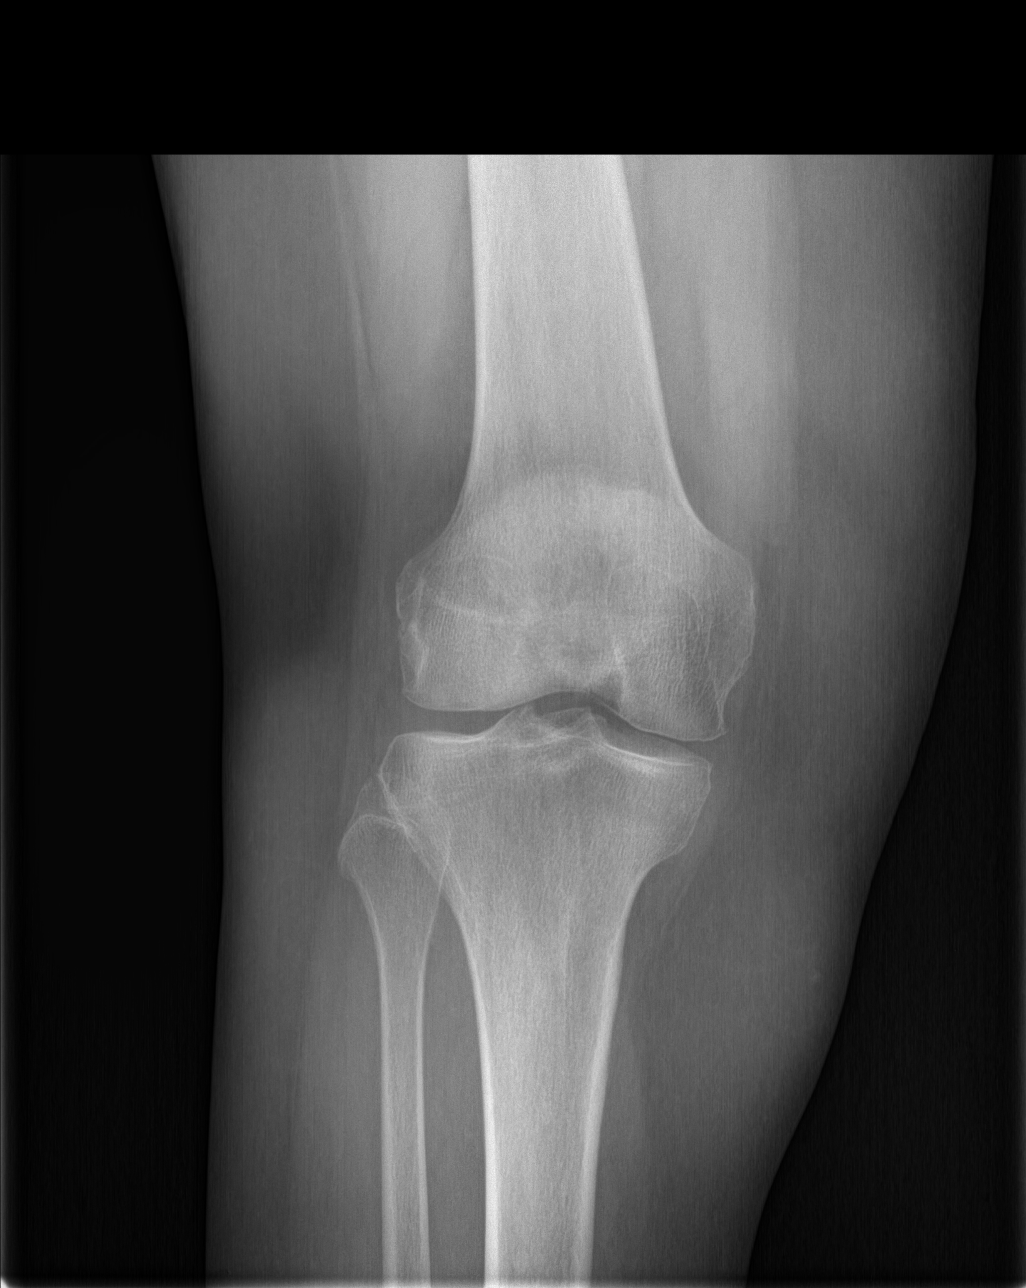

[knee obl (1 of 2)]
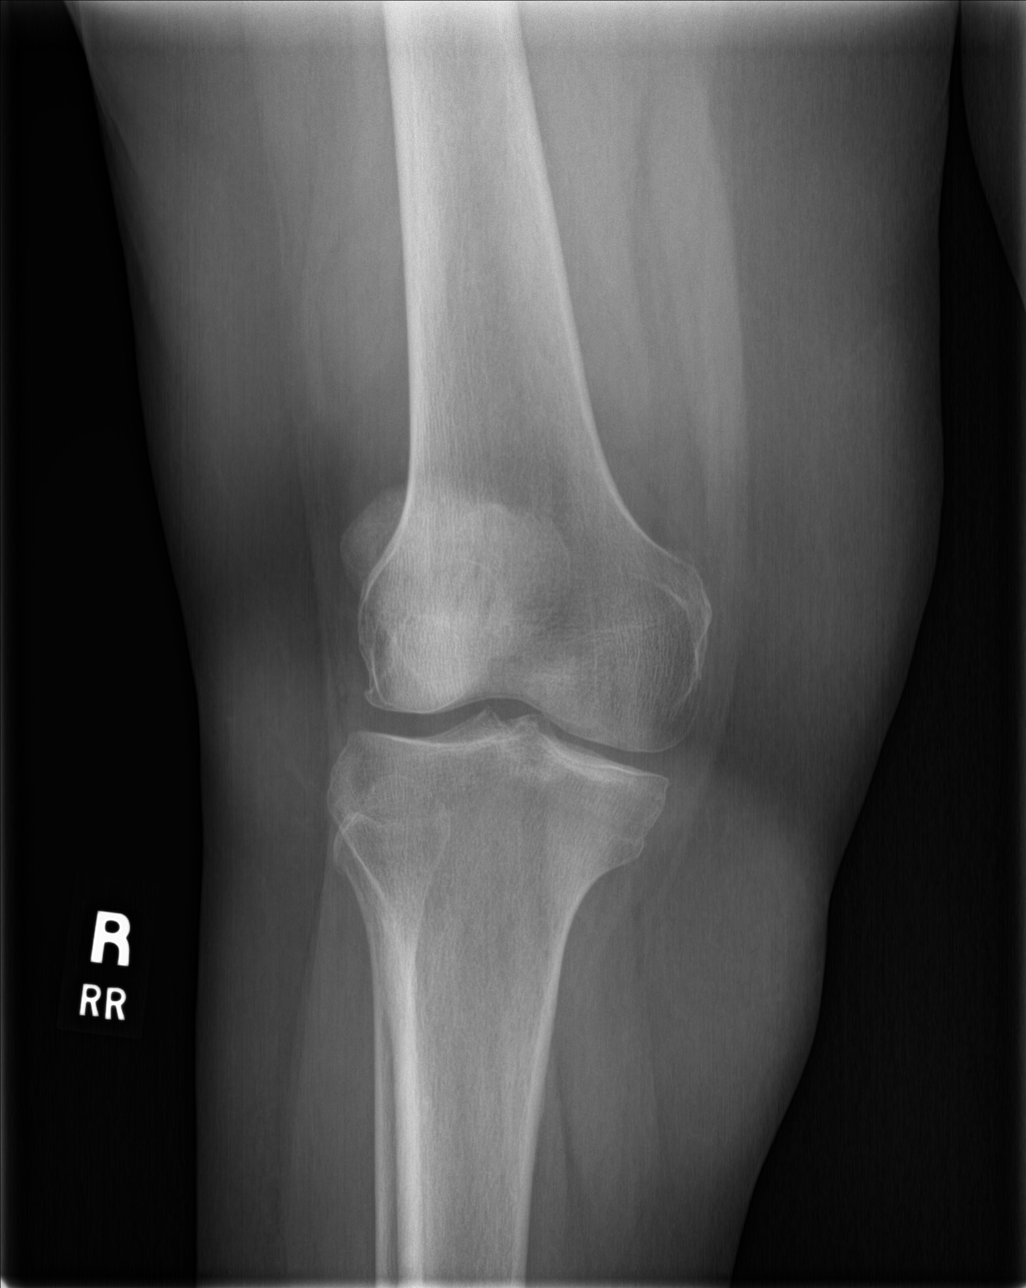

[knee obl (2 of 2)]
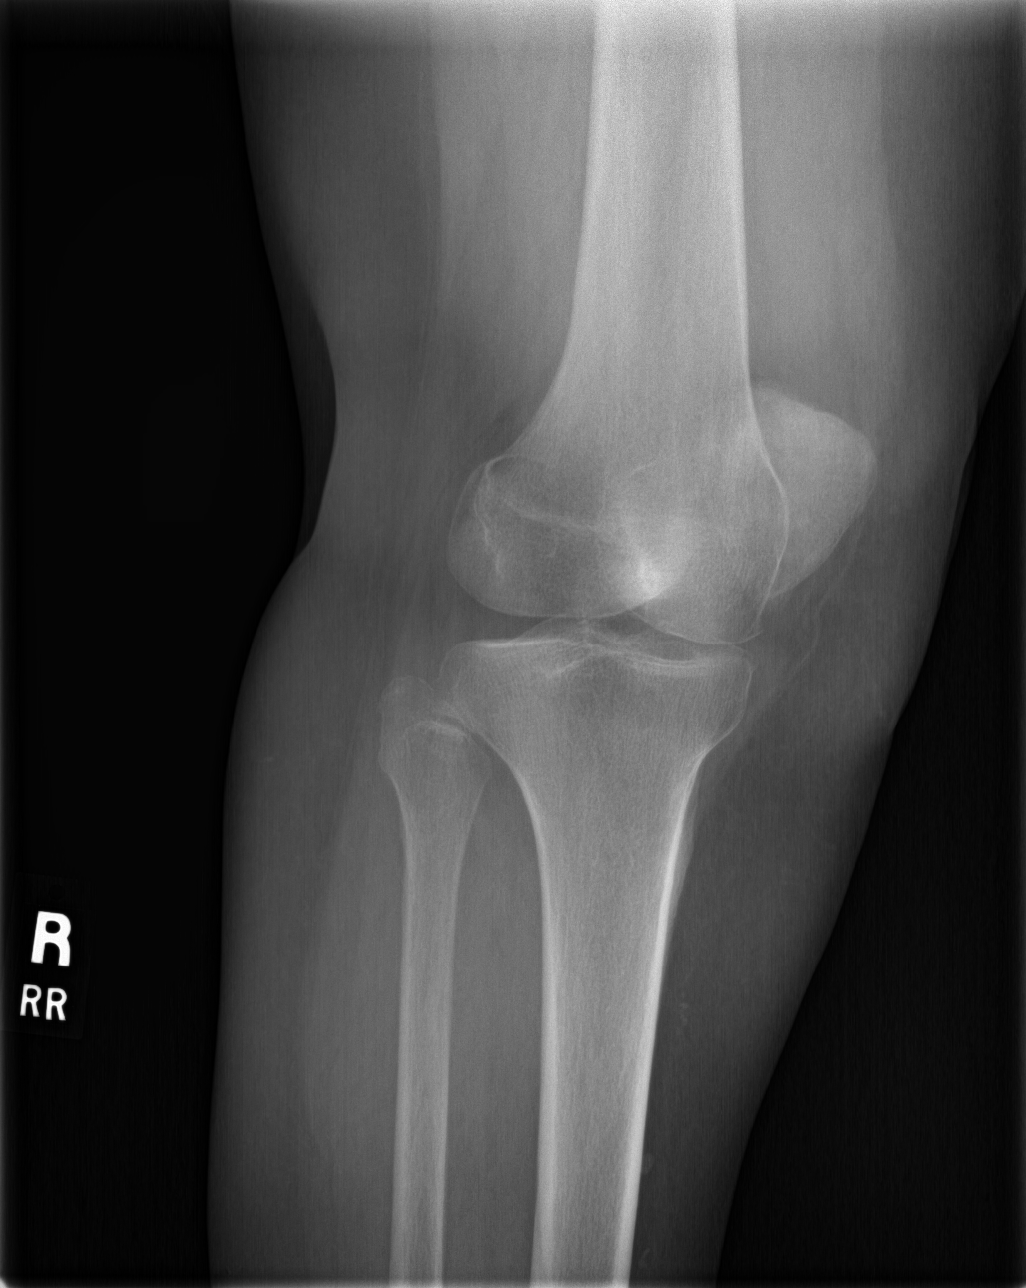

[knee lat]
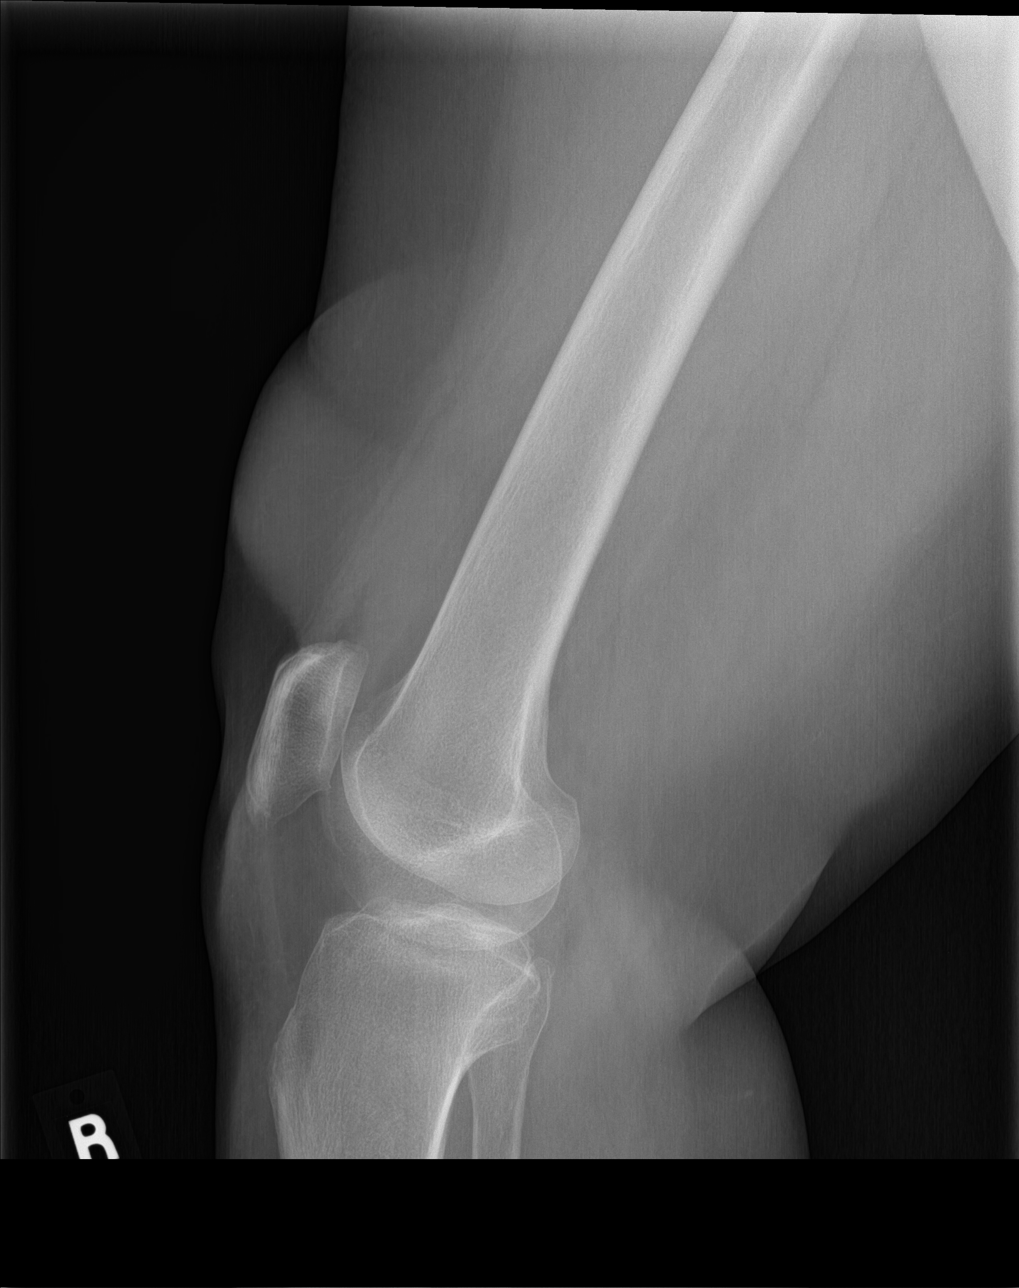

[4 of 4 positions shown; findings below may reference images not displayed]

FINDINGS: No acute fracture or dislocation is noted. Mild tricompartmental
degenerative change is seen. No joint effusion is noted.
IMPRESSION: Mild degenerative change without acute abnormality per
